# Patient Record
Sex: Female | Born: 1985 | Race: Black or African American | Hispanic: No | Marital: Single | Smoking: Former smoker
Health system: Southern US, Community
[De-identification: ages and names within clinical notes are randomized; demographics above are authoritative.]

## PROBLEM LIST (undated history)

## (undated) DIAGNOSIS — R51 Headache: Principal | ICD-10-CM

## (undated) DIAGNOSIS — N939 Abnormal uterine and vaginal bleeding, unspecified: Secondary | ICD-10-CM

## (undated) DIAGNOSIS — G8929 Other chronic pain: Secondary | ICD-10-CM

## (undated) DIAGNOSIS — R519 Headache, unspecified: Secondary | ICD-10-CM

## (undated) HISTORY — DX: Abnormal uterine and vaginal bleeding, unspecified: N93.9

## (undated) HISTORY — DX: Headache: R51

## (undated) HISTORY — DX: Other chronic pain: G89.29

## (undated) HISTORY — DX: Headache, unspecified: R51.9

---
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
## 2007-06-26 ENCOUNTER — Emergency Department (HOSPITAL_COMMUNITY): Admission: EM | Admit: 2007-06-26 | Discharge: 2007-06-26 | Payer: Self-pay | Admitting: Emergency Medicine

## 2007-07-12 ENCOUNTER — Emergency Department (HOSPITAL_COMMUNITY): Admission: EM | Admit: 2007-07-12 | Discharge: 2007-07-12 | Payer: Self-pay | Admitting: Emergency Medicine

## 2008-02-14 ENCOUNTER — Emergency Department (HOSPITAL_COMMUNITY): Admission: EM | Admit: 2008-02-14 | Discharge: 2008-02-14 | Payer: Self-pay | Admitting: Emergency Medicine

## 2008-05-16 ENCOUNTER — Emergency Department (HOSPITAL_COMMUNITY): Admission: EM | Admit: 2008-05-16 | Discharge: 2008-05-16 | Payer: Self-pay | Admitting: Emergency Medicine

## 2008-07-04 ENCOUNTER — Emergency Department (HOSPITAL_COMMUNITY): Admission: EM | Admit: 2008-07-04 | Discharge: 2008-07-04 | Payer: Self-pay | Admitting: Emergency Medicine

## 2008-07-07 ENCOUNTER — Emergency Department (HOSPITAL_COMMUNITY): Admission: EM | Admit: 2008-07-07 | Discharge: 2008-07-07 | Payer: Self-pay | Admitting: Emergency Medicine

## 2008-08-23 ENCOUNTER — Emergency Department (HOSPITAL_COMMUNITY): Admission: EM | Admit: 2008-08-23 | Discharge: 2008-08-23 | Payer: Self-pay | Admitting: Emergency Medicine

## 2008-12-12 ENCOUNTER — Emergency Department (HOSPITAL_COMMUNITY): Admission: EM | Admit: 2008-12-12 | Discharge: 2008-12-12 | Payer: Self-pay | Admitting: Emergency Medicine

## 2010-05-21 ENCOUNTER — Ambulatory Visit: Admit: 2010-05-21 | Payer: Self-pay | Admitting: Internal Medicine

## 2010-07-03 ENCOUNTER — Encounter: Payer: Self-pay | Admitting: Internal Medicine

## 2010-07-03 ENCOUNTER — Ambulatory Visit (INDEPENDENT_AMBULATORY_CARE_PROVIDER_SITE_OTHER): Payer: 59 | Admitting: Internal Medicine

## 2010-07-03 VITALS — BP 110/80 | HR 97 | Temp 98.8°F | Ht 65.75 in | Wt 180.0 lb

## 2010-07-03 DIAGNOSIS — R51 Headache: Secondary | ICD-10-CM

## 2010-07-03 MED ORDER — ISOMETHEPTENE-APAP-DICHLORAL 65-325-100 MG PO CAPS: 1.0000 | ORAL_CAPSULE | ORAL | Status: DC | PRN

## 2010-07-04 ENCOUNTER — Encounter: Payer: Self-pay | Admitting: Internal Medicine

## 2010-07-04 NOTE — Progress Notes (Signed)
  Subjective:    Patient ID: Amy Lozano, female    DOB: 07-Mar-1986, 25 y.o.   MRN: [id number]  HPI Pt presents to clinic to est primary care and for evaluation of headaches. Notes regular ha's x 1year typically unilateral right brow described as throbbing without n/v. Occur monthly perimenstraul and also recently 1x/wk. Duration ~20-27mins and have been helped by excedrin migraine otc but stopped due to jitteriness. otc nsaids no help. Father has h/o migraines. Denies worst ha of life, being awakened by ha, numbness/tingling, extremity weakness or visual disturbance. No aura. Does use computer all day at work and strains. See's gyn who discussed possible ocp for the perimenstrual component of the ha's. Obtains labs through gyn and is utd. No other complaints.  Reviewed pmh, psh, medications, allergies, soc hx and fam hx    Review of Systems  Constitutional: Negative for fever, chills and fatigue.  HENT: Negative for congestion, rhinorrhea, neck pain and neck stiffness.   Eyes: Positive for photophobia. Negative for pain and visual disturbance.  Respiratory: Negative for shortness of breath and wheezing.   Neurological: Positive for headaches. Negative for dizziness, seizures, facial asymmetry, speech difficulty and numbness.  All other systems reviewed and are negative.       Objective:   Physical Exam  Nursing note and vitals reviewed. Constitutional: She appears well-developed and well-nourished. No distress.  HENT:  Head: Normocephalic and atraumatic.  Right Ear: External ear normal.  Left Ear: External ear normal.  Nose: Nose normal.  Mouth/Throat: Oropharynx is clear and moist. No oropharyngeal exudate.  Eyes: Conjunctivae and EOM are normal. Pupils are equal, round, and reactive to light. Right eye exhibits no discharge. Left eye exhibits no discharge. No scleral icterus.  Neck: Normal range of motion. Neck supple. No thyromegaly present.  Cardiovascular: Normal rate,  regular rhythm and normal heart sounds.  Exam reveals no gallop and no friction rub.   No murmur heard. Pulmonary/Chest: Effort normal and breath sounds normal. No respiratory distress. She has no wheezes. She has no rales.  Lymphadenopathy:    She has no cervical adenopathy.  Neurological: She is alert. No cranial nerve deficit. Coordination normal.  Skin: Skin is warm and dry. She is not diaphoretic.  Psychiatric: She has a normal mood and affect.          Assessment & Plan:

## 2010-07-04 NOTE — Assessment & Plan Note (Signed)
Recommend formal eye exam through optometry. Begin midrin prn headaches. No h/o htn. Pt to further discuss ocp's with gyn. Schedule f/u.

## 2010-08-01 LAB — URINE CULTURE: Colony Count: >=100000

## 2010-08-01 LAB — URINALYSIS, ROUTINE W REFLEX MICROSCOPIC
Glucose, UA: NEGATIVE mg/dL
Ketones, ur: NEGATIVE mg/dL
Nitrite: NEGATIVE
Specific Gravity, Urine: 1.03 (ref 1.005–1.030)
pH: 5.5 (ref 5.0–8.0)

## 2010-08-01 LAB — WET PREP, GENITAL: Yeast Wet Prep HPF POC: NONE SEEN

## 2010-08-01 LAB — GC/CHLAMYDIA PROBE AMP, GENITAL
Chlamydia, DNA Probe: NEGATIVE
GC Probe Amp, Genital: NEGATIVE

## 2010-08-06 LAB — URINALYSIS, ROUTINE W REFLEX MICROSCOPIC
Bilirubin Urine: NEGATIVE
Hgb urine dipstick: NEGATIVE
Nitrite: NEGATIVE
Protein, ur: NEGATIVE mg/dL
Urobilinogen, UA: 0.2 mg/dL (ref 0.0–1.0)

## 2010-08-06 LAB — URINE MICROSCOPIC-ADD ON

## 2010-08-06 LAB — POCT PREGNANCY, URINE: Preg Test, Ur: NEGATIVE

## 2010-08-06 LAB — URINE CULTURE: Colony Count: >=100000

## 2010-08-10 LAB — URINE CULTURE: Colony Count: >=100000

## 2010-08-10 LAB — URINE MICROSCOPIC-ADD ON

## 2010-08-10 LAB — URINALYSIS, ROUTINE W REFLEX MICROSCOPIC
Glucose, UA: NEGATIVE mg/dL
Hgb urine dipstick: NEGATIVE
Ketones, ur: NEGATIVE mg/dL
Protein, ur: NEGATIVE mg/dL

## 2010-08-10 LAB — PREGNANCY, URINE: Preg Test, Ur: NEGATIVE

## 2010-08-14 ENCOUNTER — Ambulatory Visit: Payer: 59 | Admitting: Internal Medicine

## 2011-01-18 LAB — URINALYSIS, ROUTINE W REFLEX MICROSCOPIC
Glucose, UA: NEGATIVE
Hgb urine dipstick: NEGATIVE
Ketones, ur: NEGATIVE
Protein, ur: NEGATIVE

## 2011-01-18 LAB — GC/CHLAMYDIA PROBE AMP, GENITAL
Chlamydia, DNA Probe: NEGATIVE
GC Probe Amp, Genital: NEGATIVE

## 2011-01-18 LAB — WET PREP, GENITAL: Trich, Wet Prep: NONE SEEN

## 2011-01-18 LAB — URINE MICROSCOPIC-ADD ON

## 2012-04-05 ENCOUNTER — Emergency Department (HOSPITAL_COMMUNITY)
Admission: EM | Admit: 2012-04-05 | Discharge: 2012-04-06 | Discharge status: Against Medical Advice | Payer: 59 | Attending: Emergency Medicine | Admitting: Emergency Medicine

## 2012-04-05 ENCOUNTER — Encounter (HOSPITAL_COMMUNITY): Payer: Self-pay | Admitting: Family Medicine

## 2012-04-05 DIAGNOSIS — M549 Dorsalgia, unspecified: Secondary | ICD-10-CM | POA: Insufficient documentation

## 2012-04-05 NOTE — ED Notes (Signed)
Called times 2   no answer  

## 2012-04-05 NOTE — ED Notes (Signed)
Attempted to call pt x2

## 2012-04-05 NOTE — ED Notes (Signed)
Pt again called with no response, unable to find pt in waiting room.

## 2012-04-05 NOTE — ED Notes (Signed)
Per pt sts right side and back pain x 2 weeks. Denies urinary symptoms

## 2012-05-18 ENCOUNTER — Encounter (HOSPITAL_COMMUNITY): Payer: Self-pay | Admitting: *Deleted

## 2012-05-18 ENCOUNTER — Ambulatory Visit (HOSPITAL_COMMUNITY): Payer: 59

## 2012-05-18 ENCOUNTER — Emergency Department (HOSPITAL_COMMUNITY)
Admission: EM | Admit: 2012-05-18 | Discharge: 2012-05-18 | Disposition: A | Discharge status: Home / Self Care | Payer: 59 | Attending: Emergency Medicine | Admitting: Emergency Medicine

## 2012-05-18 DIAGNOSIS — R300 Dysuria: Secondary | ICD-10-CM | POA: Insufficient documentation

## 2012-05-18 DIAGNOSIS — Z87891 Personal history of nicotine dependence: Secondary | ICD-10-CM | POA: Insufficient documentation

## 2012-05-18 DIAGNOSIS — Z8679 Personal history of other diseases of the circulatory system: Secondary | ICD-10-CM | POA: Insufficient documentation

## 2012-05-18 DIAGNOSIS — R109 Unspecified abdominal pain: Secondary | ICD-10-CM

## 2012-05-18 DIAGNOSIS — Z3202 Encounter for pregnancy test, result negative: Secondary | ICD-10-CM | POA: Insufficient documentation

## 2012-05-18 LAB — WET PREP, GENITAL: Clue Cells Wet Prep HPF POC: NONE SEEN

## 2012-05-18 LAB — CBC WITH DIFFERENTIAL/PLATELET
Eosinophils Absolute: 0.1 10*3/uL (ref 0.0–0.7)
Eosinophils Relative: 1 % (ref 0–5)
HCT: 36.9 % (ref 36.0–46.0)
Hemoglobin: 12.4 g/dL (ref 12.0–15.0)
Lymphs Abs: 1.3 10*3/uL (ref 0.7–4.0)
MCH: 29 pg (ref 26.0–34.0)
MCHC: 33.6 g/dL (ref 30.0–36.0)
MCV: 86.2 fL (ref 78.0–100.0)
Monocytes Absolute: 0.8 10*3/uL (ref 0.1–1.0)
Monocytes Relative: 10 % (ref 3–12)
RBC: 4.28 MIL/uL (ref 3.87–5.11)

## 2012-05-18 LAB — BASIC METABOLIC PANEL
BUN: 10 mg/dL (ref 6–23)
Calcium: 9.1 mg/dL (ref 8.4–10.5)
Creatinine, Ser: 1 mg/dL (ref 0.50–1.10)
GFR calc non Af Amer: 78 mL/min — ABNORMAL LOW (ref >90)
Glucose, Bld: 88 mg/dL (ref 70–99)
Potassium: 4.2 mEq/L (ref 3.5–5.1)

## 2012-05-18 LAB — URINALYSIS, ROUTINE W REFLEX MICROSCOPIC
Bilirubin Urine: NEGATIVE
Hgb urine dipstick: NEGATIVE
Specific Gravity, Urine: 1.028 (ref 1.005–1.030)
Urobilinogen, UA: 0.2 mg/dL (ref 0.0–1.0)

## 2012-05-18 MED ORDER — SODIUM CHLORIDE 0.9 % IV BOLUS (SEPSIS)
500.0000 mL | Freq: Once | INTRAVENOUS | Status: AC
  Administered 2012-05-18: 09:00:00 via INTRAVENOUS

## 2012-05-18 MED ORDER — OXYCODONE-ACETAMINOPHEN 5-325 MG PO TABS: 1.0000 | ORAL_TABLET | Freq: Four times a day (QID) | ORAL | Status: DC | PRN

## 2012-05-18 MED ORDER — IOHEXOL 300 MG/ML  SOLN
50.0000 mL | Freq: Once | INTRAMUSCULAR | Status: AC | PRN
  Administered 2012-05-18: 50 mL via ORAL

## 2012-05-18 MED ORDER — IOHEXOL 300 MG/ML  SOLN
100.0000 mL | Freq: Once | INTRAMUSCULAR | Status: AC | PRN
  Administered 2012-05-18: 100 mL via INTRAVENOUS

## 2012-05-18 NOTE — ED Provider Notes (Signed)
History     CSN: [id number]  Arrival date & time 05/18/12  4098   First MD Initiated Contact with Patient 05/18/12 [phone]      Chief Complaint  Patient presents with  . Flank Pain  . Dysuria    (Consider location/radiation/quality/duration/timing/severity/associated sxs/prior treatment) Patient is a 27 y.o. female presenting with flank pain and dysuria. The history is provided by the patient.  Flank Pain This is a new problem. Pertinent negatives include no headaches.  Dysuria  Associated symptoms include flank pain. Pertinent negatives include no chills.   patient has had right flank and abdomen pain for the last 2 weeks. She states it is coming on. Is worse with urination. Is also worse with movement. She states she's been urinating more frequently. He states it does not hurt to urinate this has flank pain with it. She states that the urine has a different odor now. She denies vaginal bleeding or discharge. No nausea vomiting or diarrhea. No fevers.  Past Medical History  Diagnosis Date  . Chronic headaches     History reviewed. No pertinent past surgical history.  Family History  Problem Relation Age of Onset  . Diabetes Maternal Grandmother     History  Substance Use Topics  . Smoking status: Former Games developer  . Smokeless tobacco: Not on file  . Alcohol Use: Yes    OB History    Grav Para Term Preterm Abortions TAB SAB Ect Mult Living                  Review of Systems  Constitutional: Negative for fever, chills and fatigue.  Respiratory: Negative for choking.   Genitourinary: Positive for dysuria and flank pain.  Musculoskeletal: Negative for back pain and joint swelling.  Skin: Negative for pallor and rash.  Neurological: Negative for weakness and headaches.    Allergies  Review of patient's allergies indicates no known allergies.  Home Medications   Current Outpatient Rx  Name  Route  Sig  Dispense  Refill  . ALIVE WOMENS ENERGY PO   Oral   Take 1  tablet by mouth daily.         . OXYCODONE-ACETAMINOPHEN 5-325 MG PO TABS   Oral   Take 1-2 tablets by mouth every 6 (six) hours as needed for pain.   6 tablet   0     BP 110/61  Pulse 84  Temp 98.4 F (36.9 C) (Oral)  Resp 16  SpO2 99%  LMP 04/24/2012  Physical Exam  Nursing note and vitals reviewed. Constitutional: She is oriented to person, place, and time. She appears well-developed and well-nourished.  HENT:  Head: Normocephalic and atraumatic.  Eyes: EOM are normal. Pupils are equal, round, and reactive to light.  Neck: Normal range of motion. Neck supple.  Cardiovascular: Normal rate, regular rhythm and normal heart sounds.   No murmur heard. Pulmonary/Chest: Effort normal and breath sounds normal. No respiratory distress. She has no wheezes. She has no rales.  Abdominal: Soft. Bowel sounds are normal. She exhibits no distension. There is tenderness. There is no rebound and no guarding.       Right mid abdomen tenderness without rebound or guarding.   Genitourinary:       No CVA tenderness.   Musculoskeletal: Normal range of motion.  Neurological: She is alert and oriented to person, place, and time. No cranial nerve deficit.  Skin: Skin is warm and dry.  Psychiatric: She has a normal mood and affect. Her speech  is normal.   pelvic exam showed minimal white vaginal discharge and mild right adnexal tenderness. No cervical motion tenderness.  ED Course  Procedures (including critical care time)  Labs Reviewed  BASIC METABOLIC PANEL - Abnormal; Notable for the following:    Sodium 133 (*)     GFR calc non Af Amer 78 (*)     GFR calc Af Amer 90 (*)     All other components within normal limits  WET PREP, GENITAL - Abnormal; Notable for the following:    WBC, Wet Prep HPF POC RARE (*)     All other components within normal limits  URINALYSIS, ROUTINE W REFLEX MICROSCOPIC  POCT PREGNANCY, URINE  CBC WITH DIFFERENTIAL  GC/CHLAMYDIA PROBE AMP   Ct Abdomen  Pelvis W Contrast  05/18/2012  *RADIOLOGY REPORT*  Clinical Data: Right-sided flank pain radiating to the right lower quadrant.  Urinary frequency.  Dysuria.  CT ABDOMEN AND PELVIS WITH CONTRAST  Technique:  Multidetector CT imaging of the abdomen and pelvis was performed following the standard protocol during bolus administration of intravenous contrast.  Contrast: OMNIPAQUE IOHEXOL 300 MG/ML  SOLN  Comparison: No priors.  Findings:  Lung Bases: Linear opacity in the right middle lobe may represent an area of subsegmental atelectasis or scarring.  Abdomen/Pelvis:  Ill-defined area of decreased attenuation/perfusion adjacent to the falciform ligament within segment 4B of the liver is nonspecific, but favored to represent either a benign perfusion anomaly or an area of focal fat.  The liver is otherwise unremarkable in appearance.  The appearance of the gallbladder, pancreas, spleen, bilateral adrenal glands and bilateral kidneys is unremarkable.  Normal appendix.  The trace amount of free fluid the cul-de-sac is presumably physiologic in this young female patient. No larger volume of ascites.  No pneumoperitoneum.  No pathologic distension of small bowel.  No definite pathologic lymphadenopathy identified within the abdomen or pelvis.  Uterus and ovaries are unremarkable in appearance.  Musculoskeletal: There are no aggressive appearing lytic or blastic lesions noted in the visualized portions of the skeleton.  IMPRESSION: 1.  No acute findings in the abdomen or pelvis to account for the patient's symptoms.  Specifically, the appendix is normal. 2.  There is a trace volume of free fluid the cul-de-sac which is presumably physiologic in this young female patient.   Original Report Authenticated By: Trudie Reed, M.D.      1. Abdominal pain       MDM  Patient presents with abdominal pain dysuria. Urine is reassuring. Continue tenderness a CT was done, which does not show a clear cause. Patient was  discharged home        Juliet Rude. Rubin Payor, MD 05/18/12 1546

## 2012-05-18 NOTE — ED Notes (Signed)
Pt reports R sided flank pain, worse with movement, laughing, coughing and urination. Denies burning with urination, just same type of flank pain, just stronger and odor to urine.

## 2012-05-19 LAB — GC/CHLAMYDIA PROBE AMP: GC Probe RNA: NEGATIVE

## 2012-05-29 ENCOUNTER — Encounter: Payer: Self-pay | Admitting: *Deleted

## 2012-07-14 ENCOUNTER — Ambulatory Visit: Payer: Self-pay | Admitting: Nurse Practitioner

## 2014-01-03 IMAGING — CT CT ABD-PELV W/ CM
1 of 2 series · 15 of 32 positions shown, 19 images · IV contrast (OMNIPAQUE 300)
Comparison: No priors.

CLINICAL DATA: Right-sided flank pain radiating to the right lower
quadrant.  Urinary frequency.  Dysuria.

CT ABDOMEN AND PELVIS WITH CONTRAST
TECHNIQUE: Multidetector CT imaging of the abdomen and pelvis was
performed following the standard protocol during bolus
administration of intravenous contrast.
Contrast: 100mL OMNIPAQUE IOHEXOL 300 MG/ML  SOLN

[Series 2: abd/pel with · axial · 0.62mm/px · z∈[-467,-57]mm · 15 of 90 slices shown, 19 images]
[im 4/90  soft-tissue]
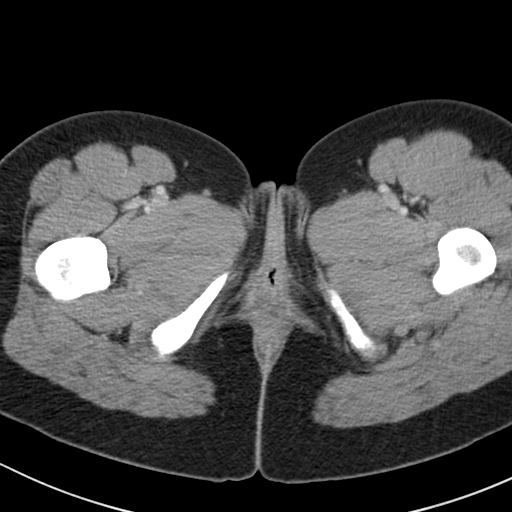
[im 4/90  bone]
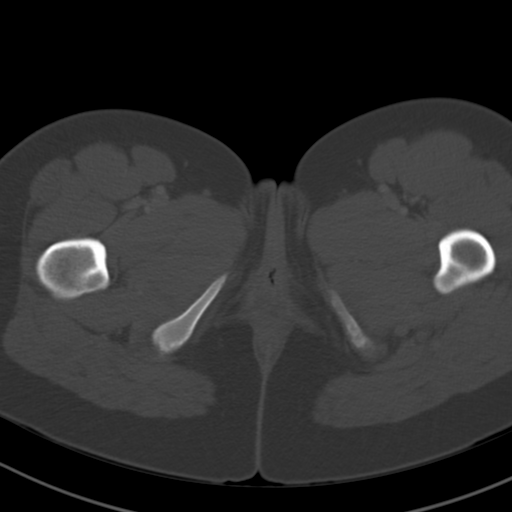
[im 11/90  soft-tissue]
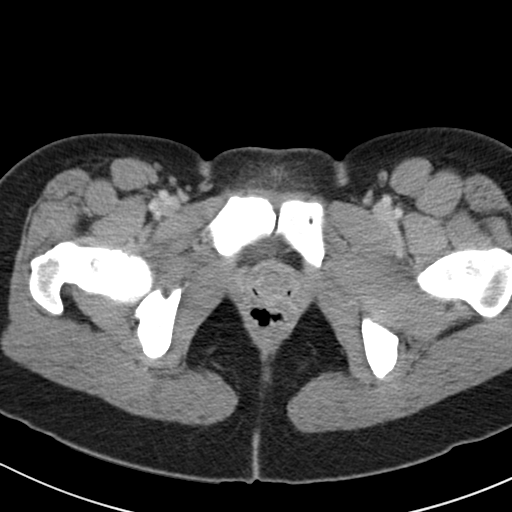
[im 18/90  soft-tissue]
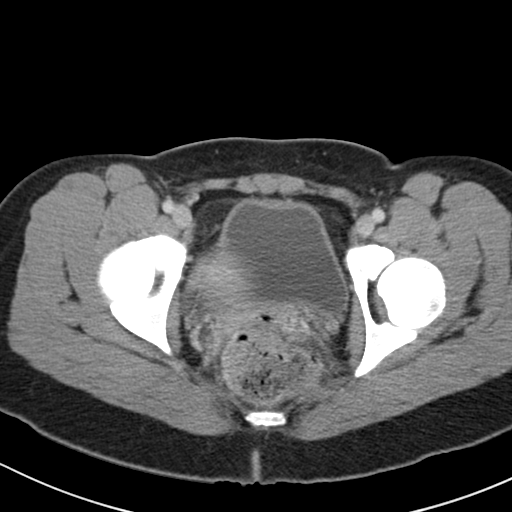
[im 24/90  soft-tissue]
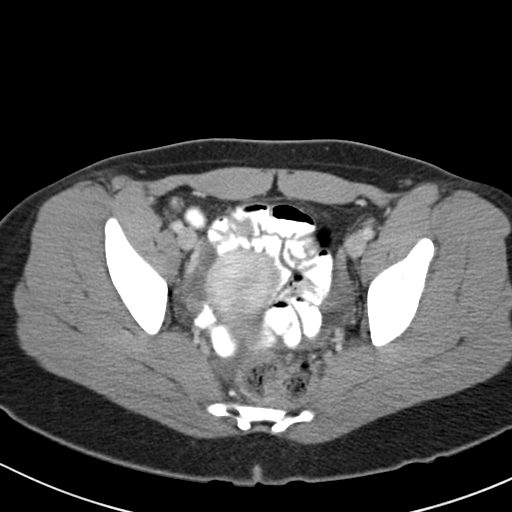
[im 31/90  soft-tissue]
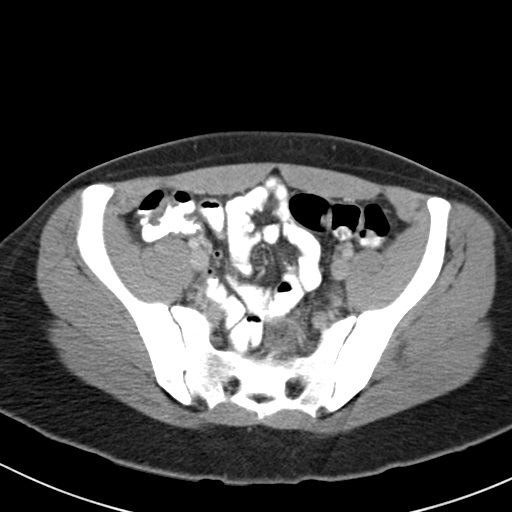
[im 38/90  soft-tissue]
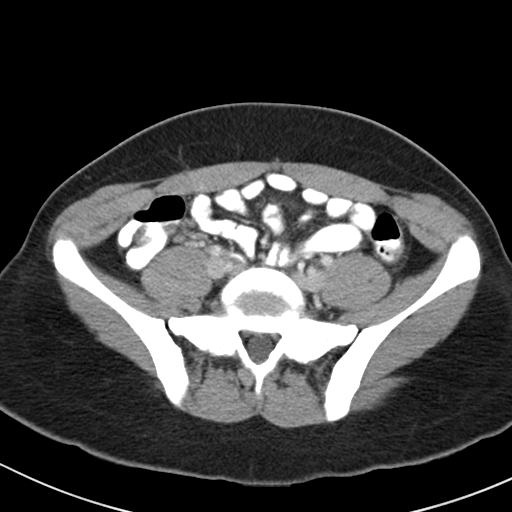
[im 45/90  soft-tissue]
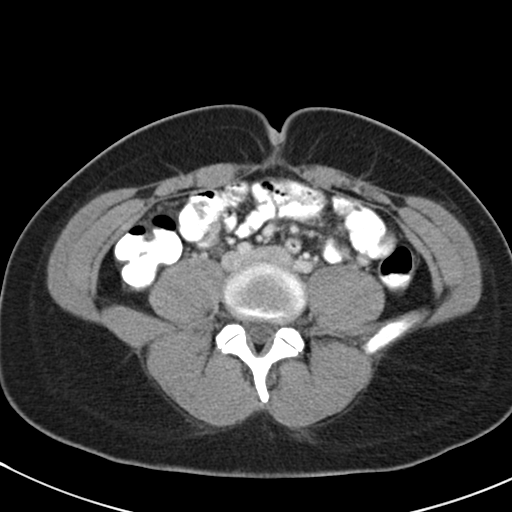
[im 52/90  soft-tissue]
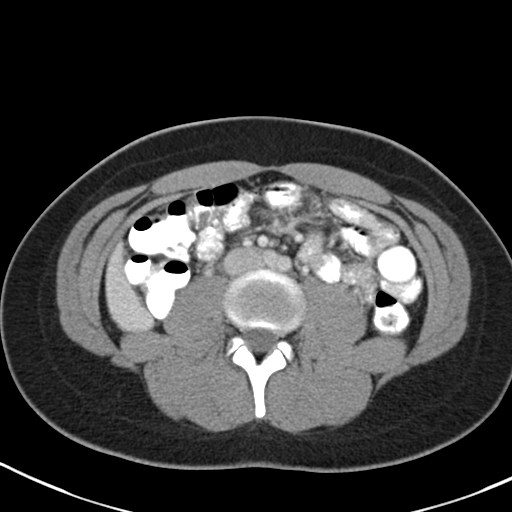
[im 59/90  soft-tissue]
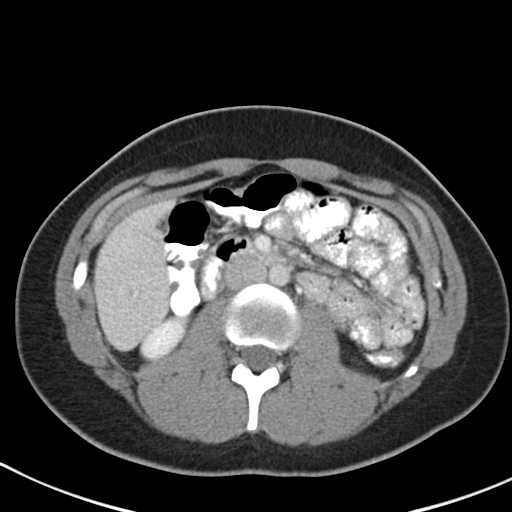
[im 59/90  bone]
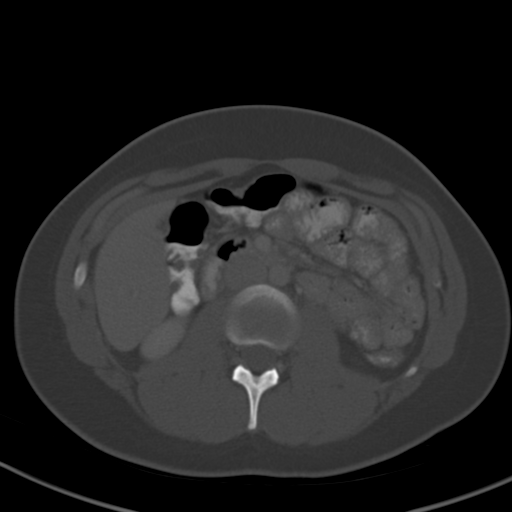
[im 66/90  soft-tissue]
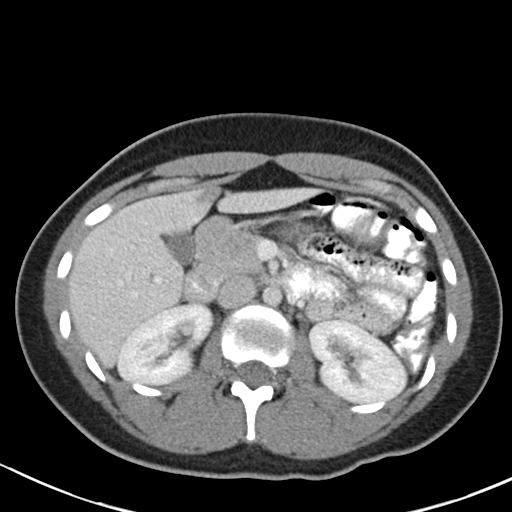
[im 72/90  soft-tissue]
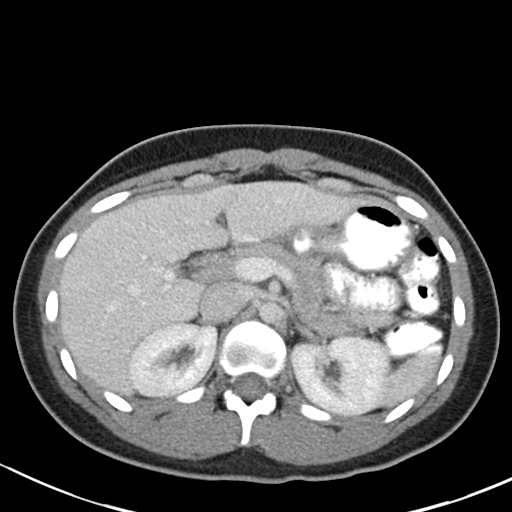
[im 76/90  lung]
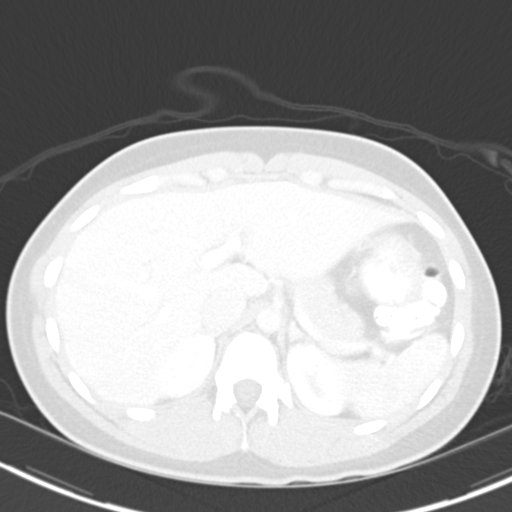
[im 79/90  soft-tissue]
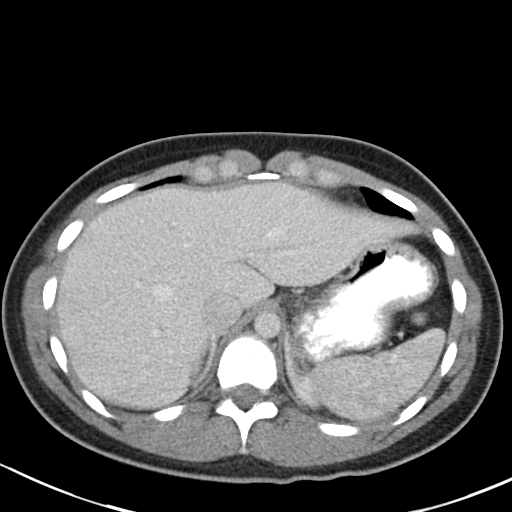
[im 79/90  lung]
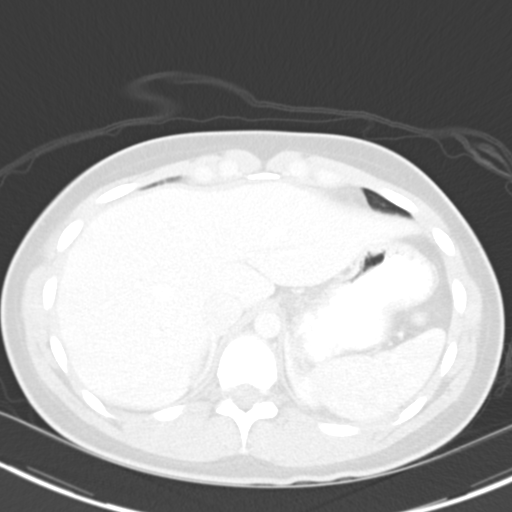
[im 83/90  lung]
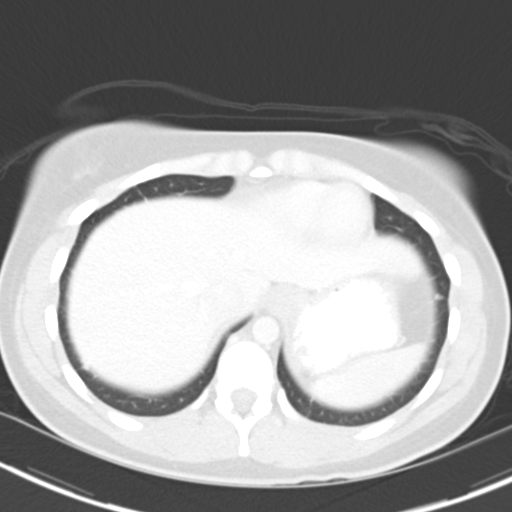
[im 86/90  soft-tissue]
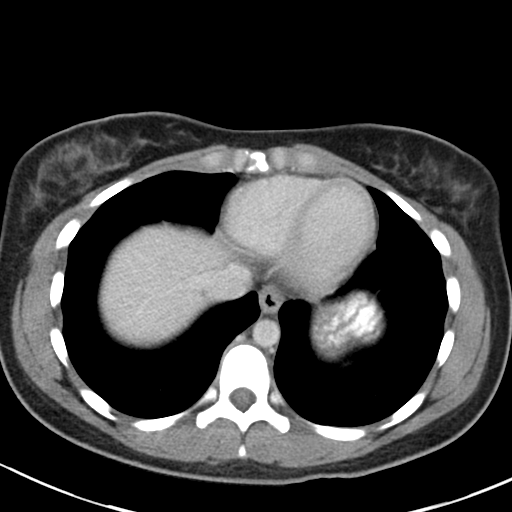
[im 86/90  lung]
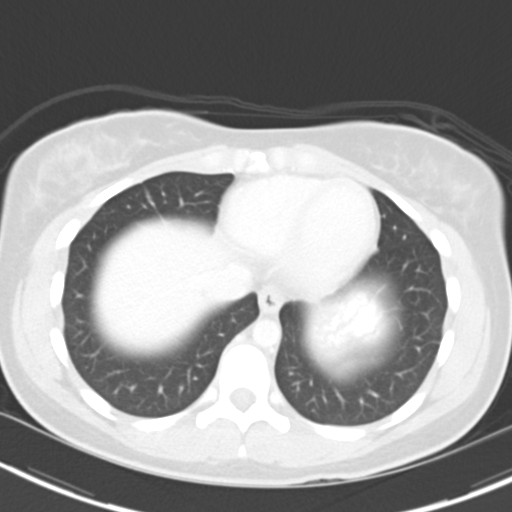

[15 of 32 positions shown; findings below may reference images not displayed]

FINDINGS: Lung Bases: Linear opacity in the right middle lobe may represent
an area of subsegmental atelectasis or scarring.

Abdomen/Pelvis:  Ill-defined area of decreased
attenuation/perfusion adjacent to the falciform ligament within
segment 4B of the liver is nonspecific, but favored to represent
either a benign perfusion anomaly or an area of focal fat.  The
liver is otherwise unremarkable in appearance.  The appearance of
the gallbladder, pancreas, spleen, bilateral adrenal glands and
bilateral kidneys is unremarkable.  Normal appendix.  The trace
amount of free fluid the cul-de-sac is presumably physiologic in
this young female patient. No larger volume of ascites.  No
pneumoperitoneum.  No pathologic distension of small bowel.  No
definite pathologic lymphadenopathy identified within the abdomen
or pelvis.  Uterus and ovaries are unremarkable in appearance.

Musculoskeletal: There are no aggressive appearing lytic or blastic
lesions noted in the visualized portions of the skeleton.
IMPRESSION: 1.  No acute findings in the abdomen or pelvis to account for the
patient's symptoms.  Specifically, the appendix is normal.
2.  There is a trace volume of free fluid the cul-de-sac which is
presumably physiologic in this young female patient.

## 2015-04-10 ENCOUNTER — Ambulatory Visit (INDEPENDENT_AMBULATORY_CARE_PROVIDER_SITE_OTHER): Payer: BLUE CROSS/BLUE SHIELD | Admitting: Obstetrics and Gynecology

## 2015-04-10 ENCOUNTER — Encounter: Payer: Self-pay | Admitting: Obstetrics and Gynecology

## 2015-04-10 VITALS — BP 112/70 | HR 92 | Resp 15 | Ht 66.0 in | Wt 180.0 lb

## 2015-04-10 DIAGNOSIS — Z113 Encounter for screening for infections with a predominantly sexual mode of transmission: Secondary | ICD-10-CM | POA: Diagnosis not present

## 2015-04-10 DIAGNOSIS — Z01419 Encounter for gynecological examination (general) (routine) without abnormal findings: Secondary | ICD-10-CM | POA: Diagnosis not present

## 2015-04-10 DIAGNOSIS — N939 Abnormal uterine and vaginal bleeding, unspecified: Secondary | ICD-10-CM

## 2015-04-10 DIAGNOSIS — N898 Other specified noninflammatory disorders of vagina: Secondary | ICD-10-CM

## 2015-04-10 DIAGNOSIS — Z0000 Encounter for general adult medical examination without abnormal findings: Secondary | ICD-10-CM | POA: Diagnosis not present

## 2015-04-10 DIAGNOSIS — N923 Ovulation bleeding: Secondary | ICD-10-CM

## 2015-04-10 DIAGNOSIS — Z124 Encounter for screening for malignant neoplasm of cervix: Secondary | ICD-10-CM

## 2015-04-10 NOTE — Patient Instructions (Signed)

## 2015-04-10 NOTE — Progress Notes (Signed)
Patient ID: Amy Lozano, female   DOB: 07/24/1985, 29 y.o.   MRN: [id number] 29 y.o. [id number] SingleAfrican AmericanF here for annual exam.  She c/o spotting randomly in between her cycles. She can saturate plus tampon in 2 hours. Cramps are mild. This has been going on for a year or more. She is sexually active, currently with a women, together x 6 months. She has been sexually active with men.  Period Cycle (Days): 28 Period Duration (Days): 7 days but she has been having spotting in between cycles  Period Pattern: Regular Menstrual Flow: Moderate, Heavy Menstrual Control: Maxi pad, Tampon, Thin pad Dysmenorrhea: (!) Mild Dysmenorrhea Symptoms: Cramping  Patient's last menstrual period was 04/03/2015.          Sexually active: Yes.    The current method of family planning is none.    Exercising: Yes.    running and abwork  Smoker:  no  Health Maintenance: Pap:  2014 WNL per patient History of abnormal Pap:  No MMG:  Never Colonoscopy:  Never BMD:   Never TDaP:  unsure Gardasil: completed all 3   reports that she has quit smoking. She has never used smokeless tobacco. She reports that she drinks alcohol. She reports that she does not use illicit drugs.Occasional ETOH. Works for [employer]. She is going to school for psychology. Finishing undergrad in 11/17, then plans to get her masters.   Past Medical History  Diagnosis Date  . Chronic headaches   . Abnormal uterine bleeding   She used to have migraines, now with tension headaches. No h/o aura's  History reviewed. No pertinent past surgical history.  Current Outpatient Prescriptions  Medication Sig Dispense Refill  . Multiple Vitamins-Minerals (ALIVE WOMENS ENERGY PO) Take 1 tablet by mouth daily.     No current facility-administered medications for this visit.    Family History  Problem Relation Age of Onset  . Diabetes Maternal Grandmother   . Hypertension Maternal Grandmother   . Heart attack Maternal Uncle   . Heart  disease Maternal Uncle   . Heart disease Maternal Grandfather     Review of Systems  Constitutional: Negative.   HENT: Negative.   Eyes: Negative.   Respiratory: Negative.   Cardiovascular: Negative.   Gastrointestinal: Negative.   Endocrine: Negative.   Genitourinary: Positive for menstrual problem.       Spotting in between menstrual cycles   Musculoskeletal: Negative.   Skin: Negative.   Allergic/Immunologic: Negative.   Neurological: Negative.   Psychiatric/Behavioral: Negative.     Exam:   BP 112/70 mmHg  Pulse 92  Resp 15  Ht 5\' 6"  (1.676 m)  Wt 180 lb (81.647 kg)  BMI 29.07 kg/m2  LMP 04/03/2015  Weight change: @WEIGHTCHANGE @ Height:   Height: 5\' 6"  (167.6 cm)  Ht Readings from Last 3 Encounters:  04/10/15 5\' 6"  (1.676 m)  07/03/10 5' 5.75" (1.67 m)    General appearance: alert, cooperative and appears stated age Head: Normocephalic, without obvious abnormality, atraumatic Neck: no adenopathy, supple, symmetrical, trachea midline and thyroid normal to inspection and palpation Lungs: clear to auscultation bilaterally Breasts: normal appearance, no masses or tenderness Heart: regular rate and rhythm Abdomen: soft, non-tender; bowel sounds normal; no masses,  no organomegaly Extremities: extremities normal, atraumatic, no cyanosis or edema Skin: Skin color, texture, turgor normal. No rashes or lesions Lymph nodes: Cervical, supraclavicular, and axillary nodes normal. No abnormal inguinal nodes palpated Neurologic: Grossly normal   Pelvic: External genitalia:  no lesions  Urethra:  normal appearing urethra with no masses, tenderness or lesions              Bartholins and Skenes: normal                 Vagina: normal appearing vagina with normal color. Slight increase in watery vaginal d/c (patient states she has noticed this off and on)              Cervix: no lesions               Bimanual Exam:  Uterus:  normal size, contour, position,  consistency, mobility, non-tender              Adnexa: no mass, fullness, tenderness               Rectovaginal: Confirms               Anus:  normal sphincter tone, no lesions  Chaperone was present for exam.  A:  Well Woman with normal exam  Intermenstrual spotting  Screening for STD's and cervical cancer  Vaginal d/c   P:   Pap with reflex  STD testing  Discussed the options of trial of OCP's or an ultrasound  She would like to set up an ultrasound, will try and set it up after her cycle  Discussed breast self exam  Affirm test sent

## 2015-04-11 ENCOUNTER — Telehealth: Payer: Self-pay | Admitting: Emergency Medicine

## 2015-04-11 LAB — WET PREP BY MOLECULAR PROBE
CANDIDA SPECIES: NEGATIVE
Gardnerella vaginalis: POSITIVE — AB
TRICHOMONAS VAG: NEGATIVE

## 2015-04-11 MED ORDER — METRONIDAZOLE 500 MG PO TABS: 500.0000 mg | ORAL_TABLET | Freq: Two times a day (BID) | ORAL | Status: AC

## 2015-04-11 NOTE — Telephone Encounter (Signed)
-----   Message from Romualdo BolkJill Evelyn Jertson, MD sent at 04/11/2015  1:06 PM EST ----- Please inform the patient that her vaginitis probe was + for BV and treat with flagyl (either oral or vaginal, her choice), no ETOH while on Flagyl.  Oral: Flagyl 500 mg BID x 7 days, or Vaginal: Metrogel, 1 applicator per vagina q day x 5 days.

## 2015-04-11 NOTE — Telephone Encounter (Signed)
Message left to return call to Ashkan Chamberland at [phone].    

## 2015-04-11 NOTE — Telephone Encounter (Signed)
Patient returned call and message from Dr. Oscar LaJertson given.  Verbalized understanding of results, has had bacterial vaginosis previously.  Verbalized understanding of instructions regarding ETOH while taking Flagyl, Instructions given regarding Flagyl. Do not mix with alcohol. If mixed with alcohol can cause severe nausea, vomiting and severe abdominal cramping. Avoid alcohol for 72 hours prior to starting medication, during treatment and 72 hours after completing medication. Normal side effects of flagyl may include a metallic taste in the mouth, slight nausea, headache, abdominal cramping, diarrhea and dizziness.   Will follow up as needed or with any concerns.  Advised GC and chlamydia pending, pap smear is still pending.  Routing to provider for final review. Patient agreeable to disposition. Will close encounter.

## 2015-04-12 LAB — IPS N GONORRHOEA AND CHLAMYDIA BY PCR

## 2015-04-14 ENCOUNTER — Telehealth: Payer: Self-pay | Admitting: *Deleted

## 2015-04-14 LAB — IPS PAP TEST WITH REFLEX TO HPV

## 2015-04-14 NOTE — Telephone Encounter (Signed)
-----   Message from Romualdo BolkJill Evelyn Jertson, MD sent at 04/13/2015  9:15 AM EST ----- Please advise the patient of normal results.

## 2015-04-14 NOTE — Telephone Encounter (Signed)
I left a message for the patient letting her know that her lab results were normal-eh

## 2015-04-14 NOTE — Telephone Encounter (Signed)
Patient returned call. She said, "It's okay for the assistant to leave me a detailed message since I probably won't be available when she calls back."

## 2015-04-14 NOTE — Telephone Encounter (Signed)
[facility] eh

## 2015-04-15 ENCOUNTER — Telehealth: Payer: Self-pay | Admitting: Emergency Medicine

## 2015-04-15 ENCOUNTER — Telehealth: Payer: Self-pay | Admitting: Obstetrics and Gynecology

## 2015-04-15 NOTE — Telephone Encounter (Signed)
Spoke with pt regarding benefit for ultrasound. Patient understood and agreeable. Patient ready to schedule. Patient scheduled 04/24/15 with Dr. Oscar LaJertson. Pt aware of arrival date and time. Pt aware of 72 hours cancellation policy with $100 fee. No further questions. Ok to close

## 2015-04-15 NOTE — Telephone Encounter (Signed)
Encounter opened erroneously.   Closed encounter.   

## 2015-04-15 NOTE — Telephone Encounter (Signed)
Called patient to discuss benefits for a procedure. Left Voicemail requesting a call. Per Dr Salli QuarryJertson's note, will need to try and set up US after her menses.

## 2015-04-15 NOTE — Telephone Encounter (Signed)
Spoke with pt regarding benefit for ultrasound. Patient understood and agreeable. Patient ready to schedule. Pt requested someone call her at 4:15 today to schedule. Talked with Olivia Mackie regarding the timing of scheduling. Olivia Mackie conversed with Dr Talbert Nan who agreed ok to scheduled before the end of the year. Pts 2016 benefits will apply (had met 2016 deductible). Will call pt back as requested at 4:15 at phone number [phone]

## 2015-04-24 ENCOUNTER — Telehealth: Payer: Self-pay | Admitting: Obstetrics and Gynecology

## 2015-04-24 ENCOUNTER — Encounter: Payer: Self-pay | Admitting: Obstetrics and Gynecology

## 2015-04-24 ENCOUNTER — Other Ambulatory Visit: Payer: BLUE CROSS/BLUE SHIELD

## 2015-04-24 NOTE — Telephone Encounter (Signed)
Left message to call Kaitlyn at [phone]. 

## 2015-04-24 NOTE — Telephone Encounter (Signed)
Patient dnka her PUS/Consult appointment today with Dr.Jertson. Patient needs to be rescheduled, triage to contact patient to reschedule.

## 2015-05-05 NOTE — Telephone Encounter (Signed)
-----   Message from Romualdo BolkJill Evelyn Jertson, MD sent at 05/02/2015 11:34 AM EST ----- Can you see if this patient wants the blood work done? If not please remove it from my box.  Thanks!!  ----- Message -----    From: SYSTEM    Sent: 04/15/2015  12:04 AM      To: Romualdo BolkJill Evelyn Jertson, MD

## 2015-05-05 NOTE — Telephone Encounter (Signed)
I left patient another message in regards to having her labs drawn-eh

## 2015-05-21 NOTE — Telephone Encounter (Signed)
Left message to call Kaitlyn at [phone]. 

## 2015-05-27 NOTE — Telephone Encounter (Signed)
Amy Lozano and I have tried to reach this patient x3 via phone to discuss rescheduling her PUS/consult and having her labs performed. The patient has not returned calls. Would you like for me to send a letter at this time?

## 2015-05-28 NOTE — Telephone Encounter (Signed)
Letter placed in the mail to patient's home address on file.  Routing to provider as Lorain Childes as this encounter was previously closed.

## 2015-05-28 NOTE — Telephone Encounter (Signed)
Yes, please send her a letter. If she is continuing to have random spotting she should either do a trial of OCP's or have an ultrasound. She doesn't have to do the labs if she doesn't want to.

## 2015-05-28 NOTE — Telephone Encounter (Signed)
Letter to Dr.Jertson for review and signature prior to sending.

## 2015-09-16 ENCOUNTER — Encounter: Payer: Self-pay | Admitting: Obstetrics and Gynecology

## 2015-10-16 ENCOUNTER — Encounter: Payer: Self-pay | Admitting: Obstetrics and Gynecology

## 2016-04-29 ENCOUNTER — Ambulatory Visit: Payer: BLUE CROSS/BLUE SHIELD | Admitting: Obstetrics and Gynecology

## 2018-11-08 DIAGNOSIS — N938 Other specified abnormal uterine and vaginal bleeding: Secondary | ICD-10-CM | POA: Diagnosis not present

## 2018-11-08 DIAGNOSIS — Z01419 Encounter for gynecological examination (general) (routine) without abnormal findings: Secondary | ICD-10-CM | POA: Diagnosis not present

## 2018-11-08 DIAGNOSIS — Z113 Encounter for screening for infections with a predominantly sexual mode of transmission: Secondary | ICD-10-CM | POA: Diagnosis not present

## 2019-03-15 DIAGNOSIS — Z03818 Encounter for observation for suspected exposure to other biological agents ruled out: Secondary | ICD-10-CM | POA: Diagnosis not present

## 2019-03-15 DIAGNOSIS — Z20828 Contact with and (suspected) exposure to other viral communicable diseases: Secondary | ICD-10-CM | POA: Diagnosis not present

## 2019-04-24 DIAGNOSIS — N760 Acute vaginitis: Secondary | ICD-10-CM | POA: Diagnosis not present

## 2019-04-24 DIAGNOSIS — N930 Postcoital and contact bleeding: Secondary | ICD-10-CM | POA: Diagnosis not present

## 2019-05-09 DIAGNOSIS — J340 Abscess, furuncle and carbuncle of nose: Secondary | ICD-10-CM | POA: Diagnosis not present

## 2019-05-18 DIAGNOSIS — N930 Postcoital and contact bleeding: Secondary | ICD-10-CM | POA: Diagnosis not present

## 2019-05-18 DIAGNOSIS — D251 Intramural leiomyoma of uterus: Secondary | ICD-10-CM | POA: Diagnosis not present

## 2019-07-24 DIAGNOSIS — M778 Other enthesopathies, not elsewhere classified: Secondary | ICD-10-CM | POA: Diagnosis not present

## 2019-08-31 DIAGNOSIS — Z0000 Encounter for general adult medical examination without abnormal findings: Secondary | ICD-10-CM | POA: Diagnosis not present

## 2019-08-31 DIAGNOSIS — Z131 Encounter for screening for diabetes mellitus: Secondary | ICD-10-CM | POA: Diagnosis not present

## 2019-08-31 DIAGNOSIS — G44209 Tension-type headache, unspecified, not intractable: Secondary | ICD-10-CM | POA: Diagnosis not present

## 2019-08-31 DIAGNOSIS — Z13220 Encounter for screening for lipoid disorders: Secondary | ICD-10-CM | POA: Diagnosis not present

## 2019-08-31 DIAGNOSIS — L0202 Furuncle of face: Secondary | ICD-10-CM | POA: Diagnosis not present

## 2019-09-11 DIAGNOSIS — R220 Localized swelling, mass and lump, head: Secondary | ICD-10-CM | POA: Diagnosis not present

## 2019-09-11 DIAGNOSIS — Z131 Encounter for screening for diabetes mellitus: Secondary | ICD-10-CM | POA: Diagnosis not present

## 2019-10-05 DIAGNOSIS — J3489 Other specified disorders of nose and nasal sinuses: Secondary | ICD-10-CM | POA: Diagnosis not present

## 2019-10-05 DIAGNOSIS — L539 Erythematous condition, unspecified: Secondary | ICD-10-CM | POA: Diagnosis not present

## 2019-10-05 DIAGNOSIS — L089 Local infection of the skin and subcutaneous tissue, unspecified: Secondary | ICD-10-CM | POA: Diagnosis not present

## 2019-10-05 DIAGNOSIS — R220 Localized swelling, mass and lump, head: Secondary | ICD-10-CM | POA: Diagnosis not present

## 2019-11-02 DIAGNOSIS — L089 Local infection of the skin and subcutaneous tissue, unspecified: Secondary | ICD-10-CM | POA: Diagnosis not present

## 2020-05-15 DIAGNOSIS — Z01419 Encounter for gynecological examination (general) (routine) without abnormal findings: Secondary | ICD-10-CM | POA: Diagnosis not present

## 2020-05-15 DIAGNOSIS — Z124 Encounter for screening for malignant neoplasm of cervix: Secondary | ICD-10-CM | POA: Diagnosis not present

## 2020-05-29 DIAGNOSIS — Z03818 Encounter for observation for suspected exposure to other biological agents ruled out: Secondary | ICD-10-CM | POA: Diagnosis not present

## 2020-05-29 DIAGNOSIS — Z20822 Contact with and (suspected) exposure to covid-19: Secondary | ICD-10-CM | POA: Diagnosis not present

## 2020-09-20 DIAGNOSIS — H43819 Vitreous degeneration, unspecified eye: Secondary | ICD-10-CM | POA: Diagnosis not present

## 2020-11-27 DIAGNOSIS — L700 Acne vulgaris: Secondary | ICD-10-CM | POA: Diagnosis not present

## 2020-11-27 DIAGNOSIS — L905 Scar conditions and fibrosis of skin: Secondary | ICD-10-CM | POA: Diagnosis not present

## 2020-11-27 DIAGNOSIS — L814 Other melanin hyperpigmentation: Secondary | ICD-10-CM | POA: Diagnosis not present

## 2021-05-19 DIAGNOSIS — Z01419 Encounter for gynecological examination (general) (routine) without abnormal findings: Secondary | ICD-10-CM | POA: Diagnosis not present

## 2021-09-16 DIAGNOSIS — R0789 Other chest pain: Secondary | ICD-10-CM | POA: Diagnosis not present

## 2021-09-16 DIAGNOSIS — R079 Chest pain, unspecified: Secondary | ICD-10-CM | POA: Diagnosis not present

## 2021-12-25 DIAGNOSIS — N898 Other specified noninflammatory disorders of vagina: Secondary | ICD-10-CM | POA: Diagnosis not present
